# Patient Record
Sex: Female | Born: 1980 | Race: White | Hispanic: No | Marital: Single | Smoking: Current every day smoker
Health system: Southern US, Community
[De-identification: ages and names within clinical notes are randomized; demographics above are authoritative.]

## PROBLEM LIST (undated history)

## (undated) DIAGNOSIS — T4145XA Adverse effect of unspecified anesthetic, initial encounter: Secondary | ICD-10-CM

## (undated) DIAGNOSIS — K219 Gastro-esophageal reflux disease without esophagitis: Secondary | ICD-10-CM

## (undated) DIAGNOSIS — F32A Depression, unspecified: Secondary | ICD-10-CM

## (undated) DIAGNOSIS — M1990 Unspecified osteoarthritis, unspecified site: Secondary | ICD-10-CM

## (undated) DIAGNOSIS — F419 Anxiety disorder, unspecified: Secondary | ICD-10-CM

## (undated) DIAGNOSIS — R519 Headache, unspecified: Secondary | ICD-10-CM

## (undated) DIAGNOSIS — F329 Major depressive disorder, single episode, unspecified: Secondary | ICD-10-CM

## (undated) DIAGNOSIS — N200 Calculus of kidney: Secondary | ICD-10-CM

## (undated) DIAGNOSIS — T8859XA Other complications of anesthesia, initial encounter: Secondary | ICD-10-CM

## (undated) DIAGNOSIS — Z8489 Family history of other specified conditions: Secondary | ICD-10-CM

## (undated) DIAGNOSIS — R51 Headache: Secondary | ICD-10-CM

## (undated) DIAGNOSIS — J302 Other seasonal allergic rhinitis: Secondary | ICD-10-CM

## (undated) HISTORY — PX: LABIOPLASTY: SHX1900

## (undated) HISTORY — PX: WISDOM TOOTH EXTRACTION: SHX21

---

## 2004-11-10 ENCOUNTER — Emergency Department: Payer: Self-pay | Admitting: Emergency Medicine

## 2005-02-20 ENCOUNTER — Ambulatory Visit: Payer: Self-pay | Admitting: Internal Medicine

## 2005-02-25 ENCOUNTER — Ambulatory Visit: Payer: Self-pay | Admitting: Internal Medicine

## 2005-10-07 ENCOUNTER — Ambulatory Visit: Payer: Self-pay | Admitting: Internal Medicine

## 2005-12-28 ENCOUNTER — Ambulatory Visit: Payer: Self-pay | Admitting: Obstetrics and Gynecology

## 2009-06-02 ENCOUNTER — Emergency Department: Payer: Self-pay | Admitting: Emergency Medicine

## 2012-06-07 ENCOUNTER — Ambulatory Visit: Payer: Self-pay | Admitting: Internal Medicine

## 2012-08-17 ENCOUNTER — Ambulatory Visit: Payer: Self-pay | Admitting: Obstetrics and Gynecology

## 2012-12-16 ENCOUNTER — Ambulatory Visit: Payer: Self-pay | Admitting: Unknown Physician Specialty

## 2014-01-31 ENCOUNTER — Ambulatory Visit: Payer: Self-pay | Admitting: Internal Medicine

## 2014-09-06 NOTE — H&P (Signed)
Chief Complaint:    Marilyn Jackson is a 34 y.o. female here for chronic pelvic pain.  Chronic pelvic pain: Started in Dec of last year. Right sided pelvic pain that radiates to right leg, is associated with eating sometimes, and sometimes voiding improves the pain or makes it worse. No dysparunia. Hx of kidney stones. Hx long term OCP use that stopped 2 months prior to the onset of this pain.  Prior workup included vaginal exam, with significant right pain and right pudendal neuralgia; CT abdomen and pelvis1/20/16 that was normal; TVUS 02/14/14 that was normal with trace fluid in cul de sac and physiologic cysts bilaterally; Trial of micronor and subsequently combined OCPs without relief from pain;  Prior labs: UA with many crystals, normal CBC, normal CMP, normal TSH. Last pap 09/2013 neg with neg HPV   Past Medical History:  has a past medical history of Obesity; ADD (attention deficit disorder); Right ureteral stone; Chronic migraine without aura (09/27/2013); and cervical fibromuscular dysplasia.  Past Surgical History:  has past surgical history that includes other surgery (12-28-2005) and Colposcopy (04-02-2005). Family History: family history includes Coronary artery disease in her other; Diabetes mellitus in her other; Hypertension in her father. Social History:  reports that she has been smoking. She has never used smokeless tobacco. She reports that she drinks alcohol. She reports that she does not use illicit drugs. OB/GYN History:  OB History    Gravida Para Term Preterm AB TAB SAB Ectopic Multiple Living        Allergies: has No Known Allergies. Medications:  Current outpatient prescriptions:  . cetirizine (ZYRTEC) 10 MG tablet, Take 10 mg by mouth once daily., Disp: , Rfl:  . cyanocobalamin (VITAMIN B12) 1000 MCG tablet, Take 1,000 mcg by mouth once daily., Disp: , Rfl:  . dextroamphetamine-amphetamine (ADDERALL) 10 mg  tablet, Take 1 tablet (10 mg total) by mouth 3 (three) times daily. Earliest Fill Date: 08/10/14, Disp: 90 tablet, Rfl: 0 . etodolac (LODINE) 400 MG tablet, TAKE 1 TABLET BY MOUTH TWICE DAILY WITH FOOD (Patient taking differently: TAKE 1 TABLET BY MOUTH ONCE DAILY), Disp: 60 tablet, Rfl: 0 . ranitidine (ZANTAC) 75 MG tablet, Take 75 mg by mouth once daily as needed. , Disp: , Rfl:  . sertraline (ZOLOFT) 100 MG tablet, take 1 tablet by mouth every morning, Disp: 30 tablet, Rfl: 0 . temazepam (RESTORIL) 30 mg capsule, Take 30 mg by mouth nightly., Disp: , Rfl: 0   Review of Systems: See HPI   Exam:   Filed Vitals:   09/05/14 1745  BP: 124/72  Pulse: 78   Body mass index is 39.77 kg/(m^2).  General: Well-developed, well-nourished white female in no acute distress  Lungs: CTA  CV : RRR without murmur  Breast: exam done in sitting and lying position : No dimpling or retraction, no dominant mass, no spontaneous discharge, no axillary adenopathy Neck: no thyromegaly, no lymphadenopathy Abdomen: soft, no masses, normal active bowel sounds, non-tender, no rebound tenderness, no hernias noted Genitalia:  Pelvic exam:   External: Tanner stage 5, normal female genitalia without lesions or masses  Bladder: Normal size without masses or tenderness, well-supported  Urethra: No lesions or discharge with palpation. Normal urethral size and location, no prolapse  Vagina: normal physiological discharge, without lesions or masses  Cervix: normal without lesions or masses  Adnexa: normal bimanual exam without masses or fullness - bilateral pain, R>L, no puedendal neuralgia noted todya  Uterus: Normal size and position without masses or tenderness.   Anus/Perineum: Normal external exam  Impression:   The encounter diagnosis was Pelvic pain in female.    Plan:   - Patient returns for a preoperative discussion  regarding her plans to proceed with surgical evaluation of her pelvic pain by dx lap and cystoscopy procedure. The patient and I discussed the technical aspects of the procedure including the potential for risks and complications. These include but are not limited to the risk of infection requiring post-operative antibiotics or further procedures. We talked about the risk of injury to adjacent organs including bladder, bowel, ureter, blood vessels or nerves. We talked about the need to convert to an open incision. We talked about the possible need for blood transfusion. We talked aboutpostop complications such asthromboembolic or cardiopulmonary complications. All of her questions were answered. Her preoperative exam was completed and the appropriate consents were signed. She is scheduled to undergo this procedure in the near future.  - Will plan on peritoneal biopsies, and cysto with distention to evaluate painful bladder syndrome (IC).   -If normal and no improvement, plan for referral to chronic pelvic pain clinic.  - referral to GI for evaluation of IBS sent.

## 2014-09-19 ENCOUNTER — Encounter
Admission: RE | Admit: 2014-09-19 | Discharge: 2014-09-19 | Disposition: A | Discharge status: Home / Self Care | Payer: BLUE CROSS/BLUE SHIELD | Source: Ambulatory Visit | Attending: Obstetrics and Gynecology | Admitting: Obstetrics and Gynecology

## 2014-09-19 DIAGNOSIS — K219 Gastro-esophageal reflux disease without esophagitis: Secondary | ICD-10-CM | POA: Insufficient documentation

## 2014-09-19 HISTORY — DX: Headache, unspecified: R51.9

## 2014-09-19 HISTORY — DX: Other seasonal allergic rhinitis: J30.2

## 2014-09-19 HISTORY — DX: Major depressive disorder, single episode, unspecified: F32.9

## 2014-09-19 HISTORY — DX: Depression, unspecified: F32.A

## 2014-09-19 HISTORY — DX: Calculus of kidney: N20.0

## 2014-09-19 HISTORY — DX: Headache: R51

## 2014-09-19 HISTORY — DX: Anxiety disorder, unspecified: F41.9

## 2014-09-19 HISTORY — DX: Gastro-esophageal reflux disease without esophagitis: K21.9

## 2014-09-19 HISTORY — DX: Unspecified osteoarthritis, unspecified site: M19.90

## 2014-09-19 NOTE — Patient Instructions (Addendum)
  Your procedure is scheduled on: Friday 09/28/2014 Report to Day Surgery. 2ND FLOOR MEDICAL MALL ENTRANCE To find out your arrival time please call [phone] between 1PM - 3PM on Thursday 09/27/2014.  Remember: Instructions that are not followed completely may result in serious medical risk, up to and including death, or upon the discretion of your surgeon and anesthesiologist your surgery may need to be rescheduled.    __X__ 1. Do not eat food or drink liquids after midnight. No gum chewing or hard candies.     __X__ 2. No Alcohol for 24 hours before or after surgery.   ____ 3. Bring all medications with you on the day of surgery if instructed.    __X__ 4. Notify your doctor if there is any change in your medical condition     (cold, fever, infections).     Do not wear jewelry, make-up, hairpins, clips or nail polish.  Do not wear lotions, powders, or perfumes. You may wear deodorant.  Do not shave 48 hours prior to surgery. Men may shave face and neck.  Do not bring valuables to the hospital.    [facility] is not responsible for any belongings or valuables.               Contacts, dentures or bridgework may not be worn into surgery.  Leave your suitcase in the car. After surgery it may be brought to your room.  For patients admitted to the hospital, discharge time is determined by your                treatment team.   Patients discharged the day of surgery will not be allowed to drive home.   Please read over the following fact sheets that you were given:   Surgical Site Infection Prevention   __X__ Take these medicines the morning of surgery with A SIP OF WATER:    1. ZOLOFT  2.   3.   4.  5.  6.  ____ Fleet Enema (as directed)   __X__ Use CHG Soap as directed  ____ Use inhalers on the day of surgery  ____ Stop metformin 2 days prior to surgery    ____ Take 1/2 of usual insulin dose the night before surgery and none on the morning of surgery.   ____ Stop  Coumadin/Plavix/aspirin on   __X__ Stop Anti-inflammatories on STARTING TODAY STOP ALEVE YOU MAY USE TYLENOL FOR ACHES OR PAINS   ___X_ Stop supplements until after surgery.  KRILL OIL, VITAMIN B12 ____ Bring C-Pap to the hospital.

## 2014-09-28 ENCOUNTER — Ambulatory Visit
Admission: RE | Admit: 2014-09-28 | Payer: BLUE CROSS/BLUE SHIELD | Source: Ambulatory Visit | Admitting: Obstetrics and Gynecology

## 2014-09-28 ENCOUNTER — Encounter: Admission: RE | Payer: Self-pay | Source: Ambulatory Visit

## 2014-09-28 SURGERY — LAPAROSCOPY, DIAGNOSTIC
Anesthesia: Choice

## 2014-10-10 NOTE — H&P (Signed)
Chief Complaint:    Patient ID: Marilyn Jackson is a 34 y.o. female presenting with Pre Op Consulting  on 10/10/2014  HPI:  Chronic pelvic pain: Started in Dec of last year. Right sided pelvic pain that radiates to right leg, is associated with eating sometimes, and sometimes voiding improves the pain or makes it worse. No dysparunia. Hx of kidney stones. Hx long term OCP use that stopped 2 months prior to the onset of this pain.  Prior workup included vaginal exam, with significant right pain and right pudendal neuralgia; CT abdomen and pelvis1/20/16 that was normal; TVUS 02/14/14 that was normal with trace fluid in cul de sac and physiologic cysts bilaterally; Trial of micronor and subsequently combined OCPs without relief from pain but made her feel worse.  Prior labs: UA with many crystals, normal CBC, normal CMP, normal TSH. Last pap 09/2013 neg with neg HPV  Past Medical History:  has a past medical history of ADD (attention deficit disorder); Chronic migraine without aura (09/27/2013); cervical fibromuscular dysplasia; Obesity; and Right ureteral stone.  Past Surgical History:  has a past surgical history that includes other surgery (12-28-2005) and Colposcopy (04-02-2005). Family History: family history includes Coronary artery disease in her other; Diabetes mellitus in her other; Hypertension in her father. Social History:  reports that she has been smoking. She has been smoking about 0.50 packs per day. She has never used smokeless tobacco. She reports that she drinks alcohol. She reports that she does not use illicit drugs. OB/GYN History:  OB History    Gravida Para Term Preterm AB TAB SAB Ectopic Multiple Living        Allergies: has No Known Allergies. Medications:  Current Outpatient Prescriptions:  . cetirizine (ZYRTEC) 10 MG tablet, Take 10 mg by mouth once daily., Disp: , Rfl:  . cyanocobalamin (VITAMIN B12) 1000 MCG tablet, Take 1,000  mcg by mouth once daily., Disp: , Rfl:  . dextroamphetamine-amphetamine (ADDERALL) 10 mg tablet, Take 1 tablet (10 mg total) by mouth 3 (three) times daily. Earliest Fill Date: 09/27/14, Disp: 90 tablet, Rfl: 0 . etodolac (LODINE) 400 MG tablet, TAKE 1 TABLET BY MOUTH TWICE DAILY WITH FOOD (Patient taking differently: TAKE 1 TABLET BY MOUTH ONCE DAILY), Disp: 60 tablet, Rfl: 0 . oxyCODONE-acetaminophen (PERCOCET) 5-325 mg tablet, Take 1 tablet by mouth every 6 (six) hours as needed for Pain., Disp: 8 tablet, Rfl: 0 . ranitidine (ZANTAC) 75 MG tablet, Take 75 mg by mouth once daily as needed. , Disp: , Rfl:  . sertraline (ZOLOFT) 100 MG tablet, take 1 tablet by mouth every morning, Disp: 30 tablet, Rfl: 0 . temazepam (RESTORIL) 30 mg capsule, Take 30 mg by mouth nightly., Disp: , Rfl: 0  Review of Systems  Constitutional: Negative. Negative for fatigue and fever.  HENT: Negative.  Respiratory: Negative. Negative for shortness of breath.  Cardiovascular: Negative for chest pain and palpitations.  Gastrointestinal: Negative for abdominal pain, constipation and diarrhea.  Endocrine: Negative for cold intolerance.  Genitourinary: Positive for pelvic pain. Negative for difficulty urinating, dyspareunia, dysuria, frequency, hematuria, menstrual problem, urgency, vaginal bleeding, vaginal discharge and vaginal pain.  + Pelvic pain  Neurological: Negative for dizziness and light-headedness.  Psychiatric/Behavioral:  No changes in mood    Exam:         Visit Vitals  . BP 106/81  . Pulse 96  . Wt (!) 107.5 kg (237 lb)  . BMI 39.44 kg/m2  Physical Exam  Constitutional: She is oriented to person, place, and time. She appears well-developed and well-nourished. No distress.  Eyes: No scleral icterus.  Neck: Normal range of motion. Neck supple. No tracheal deviation present. No thyromegaly present.  Cardiovascular: Normal rate, regular rhythm and normal heart sounds.  No murmur  heard. Pulmonary/Chest: Effort normal and breath sounds normal. She has no wheezes. She has no rales.  Abdominal: She exhibits no distension and no mass. There is no tenderness. There is no rebound and no guarding.  Musculoskeletal: Normal range of motion.  Lymphadenopathy:  She has no cervical adenopathy.  Neurological: She is alert and oriented to person, place, and time.  Skin: Skin is warm and dry.  Psychiatric: She has a normal mood and affect.     Impression:   The primary encounter diagnosis was Pelvic pain in female. A diagnosis of Obesity (BMI 35.0-39.9 without comorbidity) was also pertinent to this visit.    Plan:   - Patient returns for a preoperative discussion regarding her plans to proceed with surgical evaluation of her pelvic pain by dx lap and cystoscopy procedure.   She was dx with intestinal parasites, and has been treated once. She is remaking her appointment with GI to evaluate.   The patient and I discussed the technical aspects of the procedure including the potential for risks and complications. These include but are not limited to the risk of infection requiring post-operative antibiotics or further procedures. We talked about the risk of injury to adjacent organs including bladder, bowel, ureter, blood vessels or nerves. We talked about the need to convert to an open incision. We talked about the possible need for blood transfusion. We talked aboutpostop complications such asthromboembolic or cardiopulmonary complications. All of her questions were answered. Her preoperative exam was completed and the appropriate consents were signed. She is scheduled to undergo this procedure in the near future.  - Will plan on peritoneal biopsies, and cysto with distention to evaluate painful bladder syndrome (IC).   -If normal and no improvement, plan for referral to chronic pelvic pain clinic.  - referral to GI for evaluation of IBS  renewed.

## 2014-10-12 ENCOUNTER — Encounter
Admission: RE | Admit: 2014-10-12 | Discharge: 2014-10-12 | Disposition: A | Discharge status: Home / Self Care | Payer: BLUE CROSS/BLUE SHIELD | Source: Ambulatory Visit | Attending: Obstetrics and Gynecology | Admitting: Obstetrics and Gynecology

## 2014-10-12 DIAGNOSIS — K219 Gastro-esophageal reflux disease without esophagitis: Secondary | ICD-10-CM | POA: Diagnosis not present

## 2014-10-12 DIAGNOSIS — G8929 Other chronic pain: Secondary | ICD-10-CM | POA: Diagnosis not present

## 2014-10-12 DIAGNOSIS — F988 Other specified behavioral and emotional disorders with onset usually occurring in childhood and adolescence: Secondary | ICD-10-CM | POA: Diagnosis not present

## 2014-10-12 DIAGNOSIS — Z87442 Personal history of urinary calculi: Secondary | ICD-10-CM | POA: Diagnosis not present

## 2014-10-12 DIAGNOSIS — Z79899 Other long term (current) drug therapy: Secondary | ICD-10-CM | POA: Diagnosis not present

## 2014-10-12 DIAGNOSIS — Z6835 Body mass index (BMI) 35.0-35.9, adult: Secondary | ICD-10-CM | POA: Diagnosis not present

## 2014-10-12 DIAGNOSIS — E669 Obesity, unspecified: Secondary | ICD-10-CM | POA: Diagnosis not present

## 2014-10-12 DIAGNOSIS — F418 Other specified anxiety disorders: Secondary | ICD-10-CM | POA: Diagnosis not present

## 2014-10-12 DIAGNOSIS — G43709 Chronic migraine without aura, not intractable, without status migrainosus: Secondary | ICD-10-CM | POA: Diagnosis not present

## 2014-10-12 DIAGNOSIS — Z8249 Family history of ischemic heart disease and other diseases of the circulatory system: Secondary | ICD-10-CM | POA: Diagnosis not present

## 2014-10-12 DIAGNOSIS — F17200 Nicotine dependence, unspecified, uncomplicated: Secondary | ICD-10-CM | POA: Diagnosis not present

## 2014-10-12 DIAGNOSIS — R102 Pelvic and perineal pain: Secondary | ICD-10-CM | POA: Diagnosis present

## 2014-10-12 DIAGNOSIS — Z833 Family history of diabetes mellitus: Secondary | ICD-10-CM | POA: Diagnosis not present

## 2014-10-12 DIAGNOSIS — N803 Endometriosis of pelvic peritoneum: Secondary | ICD-10-CM | POA: Diagnosis not present

## 2014-10-12 HISTORY — DX: Adverse effect of unspecified anesthetic, initial encounter: T41.45XA

## 2014-10-12 HISTORY — DX: Family history of other specified conditions: Z84.89

## 2014-10-12 HISTORY — DX: Other complications of anesthesia, initial encounter: T88.59XA

## 2014-10-12 LAB — BASIC METABOLIC PANEL
ANION GAP: 6 (ref 5–15)
BUN: 10 mg/dL (ref 6–20)
CALCIUM: 9 mg/dL (ref 8.9–10.3)
CO2: 28 mmol/L (ref 22–32)
CREATININE: 0.61 mg/dL (ref 0.44–1.00)
Chloride: 108 mmol/L (ref 101–111)
GFR calc Af Amer: >60 mL/min (ref >60)
GLUCOSE: 75 mg/dL (ref 65–99)
Potassium: 3.5 mmol/L (ref 3.5–5.1)
Sodium: 142 mmol/L (ref 135–145)

## 2014-10-12 LAB — TYPE AND SCREEN
ABO/RH(D): A POS
ANTIBODY SCREEN: NEGATIVE

## 2014-10-12 LAB — CBC
HCT: 41.3 % (ref 35.0–47.0)
HEMOGLOBIN: 13.9 g/dL (ref 12.0–16.0)
MCH: 30.2 pg (ref 26.0–34.0)
MCHC: 33.7 g/dL (ref 32.0–36.0)
MCV: 89.7 fL (ref 80.0–100.0)
PLATELETS: 235 10*3/uL (ref 150–440)
RBC: 4.6 MIL/uL (ref 3.80–5.20)
RDW: 12.7 % (ref 11.5–14.5)
WBC: 8.2 10*3/uL (ref 3.6–11.0)

## 2014-10-12 LAB — ABO/RH: ABO/RH(D): A POS

## 2014-10-12 NOTE — Patient Instructions (Addendum)
  Your procedure is scheduled on: Monday Oct. 3, 2016, arrival time is 6:00 am. Report to Same Day Surgery.   Remember: Instructions that are not followed completely may result in serious medical risk, up to and including death, or upon the discretion of your surgeon and anesthesiologist your surgery may need to be rescheduled.    __x__ 1. Do not eat food or drink liquids after midnight. No gum chewing or hard candies.     __x__ 2. No Alcohol for 24 hours before or after surgery.   ____ 3. Bring all medications with you on the day of surgery if instructed.    __x__ 4. Notify your doctor if there is any change in your medical condition     (cold, fever, infections).     Do not wear jewelry, make-up, hairpins, clips or nail polish.  Do not wear lotions, powders, or perfumes. You may wear deodorant.  Do not shave 48 hours prior to surgery. Men may shave face and neck.  Do not bring valuables to the hospital.    [facility] is not responsible for any belongings or valuables.               Contacts, dentures or bridgework may not be worn into surgery.  Leave your suitcase in the car. After surgery it may be brought to your room.  For patients admitted to the hospital, discharge time is determined by your treatment team.   Patients discharged the day of surgery will not be allowed to drive home.    Please read over the following fact sheets that you were given:   [facility] Preparing for Surgery  _x___ Take these medicines the morning of surgery with A SIP OF WATER:    1. sertraline (ZOLOFT)   ____ Fleet Enema (as directed)   ____ Use CHG Soap as directed  ____ Use inhalers on the day of surgery  ____ Stop metformin 2 days prior to surgery    ____ Take 1/2 of usual insulin dose the night before surgery and none on the morning of surgery.   ____ Stop Coumadin/Plavix/aspirin on does not apply.  _x___ Stop Anti-inflammatories now, Tylenol OK to take for pain.   _x___ Stop  supplements until after surgery.    ____ Bring C-Pap to the hospital.

## 2014-10-15 ENCOUNTER — Ambulatory Visit: Payer: BLUE CROSS/BLUE SHIELD | Admitting: Anesthesiology

## 2014-10-15 ENCOUNTER — Encounter
Admission: RE | Disposition: A | Discharge status: Home / Self Care | Payer: Self-pay | Source: Ambulatory Visit | Attending: Obstetrics and Gynecology

## 2014-10-15 ENCOUNTER — Ambulatory Visit
Admission: RE | Admit: 2014-10-15 | Discharge: 2014-10-15 | Disposition: A | Discharge status: Home / Self Care | Payer: BLUE CROSS/BLUE SHIELD | Source: Ambulatory Visit | Attending: Obstetrics and Gynecology | Admitting: Obstetrics and Gynecology

## 2014-10-15 ENCOUNTER — Encounter: Payer: Self-pay | Admitting: Obstetrics and Gynecology

## 2014-10-15 DIAGNOSIS — G43709 Chronic migraine without aura, not intractable, without status migrainosus: Secondary | ICD-10-CM | POA: Insufficient documentation

## 2014-10-15 DIAGNOSIS — Z833 Family history of diabetes mellitus: Secondary | ICD-10-CM | POA: Insufficient documentation

## 2014-10-15 DIAGNOSIS — Z87442 Personal history of urinary calculi: Secondary | ICD-10-CM | POA: Insufficient documentation

## 2014-10-15 DIAGNOSIS — G8929 Other chronic pain: Secondary | ICD-10-CM | POA: Insufficient documentation

## 2014-10-15 DIAGNOSIS — K219 Gastro-esophageal reflux disease without esophagitis: Secondary | ICD-10-CM | POA: Insufficient documentation

## 2014-10-15 DIAGNOSIS — Z6835 Body mass index (BMI) 35.0-35.9, adult: Secondary | ICD-10-CM | POA: Insufficient documentation

## 2014-10-15 DIAGNOSIS — F418 Other specified anxiety disorders: Secondary | ICD-10-CM | POA: Insufficient documentation

## 2014-10-15 DIAGNOSIS — E669 Obesity, unspecified: Secondary | ICD-10-CM | POA: Insufficient documentation

## 2014-10-15 DIAGNOSIS — R102 Pelvic and perineal pain: Secondary | ICD-10-CM | POA: Insufficient documentation

## 2014-10-15 DIAGNOSIS — F17200 Nicotine dependence, unspecified, uncomplicated: Secondary | ICD-10-CM | POA: Insufficient documentation

## 2014-10-15 DIAGNOSIS — Z8249 Family history of ischemic heart disease and other diseases of the circulatory system: Secondary | ICD-10-CM | POA: Insufficient documentation

## 2014-10-15 DIAGNOSIS — F988 Other specified behavioral and emotional disorders with onset usually occurring in childhood and adolescence: Secondary | ICD-10-CM | POA: Insufficient documentation

## 2014-10-15 DIAGNOSIS — N803 Endometriosis of pelvic peritoneum: Secondary | ICD-10-CM | POA: Insufficient documentation

## 2014-10-15 DIAGNOSIS — Z79899 Other long term (current) drug therapy: Secondary | ICD-10-CM | POA: Insufficient documentation

## 2014-10-15 HISTORY — PX: CYSTOSCOPY: SHX5120

## 2014-10-15 HISTORY — PX: LAPAROSCOPY: SHX197

## 2014-10-15 LAB — POCT PREGNANCY, URINE: Preg Test, Ur: NEGATIVE

## 2014-10-15 SURGERY — LAPAROSCOPY, DIAGNOSTIC
Anesthesia: General | Wound class: Clean Contaminated

## 2014-10-15 MED ORDER — LACTATED RINGERS IV SOLN: INTRAVENOUS | Status: DC

## 2014-10-15 MED ORDER — ONDANSETRON HCL 4 MG/2ML IJ SOLN: 4.0000 mg | Freq: Once | INTRAMUSCULAR | Status: DC | PRN

## 2014-10-15 MED ORDER — FENTANYL CITRATE (PF) 100 MCG/2ML IJ SOLN
INTRAMUSCULAR | Status: AC
  Filled 2014-10-15: qty 2

## 2014-10-15 MED ORDER — EPHEDRINE SULFATE 50 MG/ML IJ SOLN
INTRAMUSCULAR | Status: DC | PRN
  Administered 2014-10-15: 5 mg via INTRAVENOUS

## 2014-10-15 MED ORDER — GLYCOPYRROLATE 0.2 MG/ML IJ SOLN
INTRAMUSCULAR | Status: DC | PRN
  Administered 2014-10-15: 0.6 mg via INTRAVENOUS

## 2014-10-15 MED ORDER — FENTANYL CITRATE (PF) 100 MCG/2ML IJ SOLN
25.0000 ug | INTRAMUSCULAR | Status: DC | PRN
  Administered 2014-10-15 (×4): 25 ug via INTRAVENOUS

## 2014-10-15 MED ORDER — SUCCINYLCHOLINE CHLORIDE 20 MG/ML IJ SOLN
INTRAMUSCULAR | Status: DC | PRN
  Administered 2014-10-15: 100 mg via INTRAVENOUS

## 2014-10-15 MED ORDER — MIDAZOLAM HCL 2 MG/2ML IJ SOLN
INTRAMUSCULAR | Status: DC | PRN
  Administered 2014-10-15: 2 mg via INTRAVENOUS

## 2014-10-15 MED ORDER — ONDANSETRON HCL 4 MG/2ML IJ SOLN
INTRAMUSCULAR | Status: DC | PRN
  Administered 2014-10-15: 4 mg via INTRAVENOUS

## 2014-10-15 MED ORDER — BUPIVACAINE HCL (PF) 0.5 % IJ SOLN
INTRAMUSCULAR | Status: AC
  Filled 2014-10-15: qty 30

## 2014-10-15 MED ORDER — FAMOTIDINE 20 MG PO TABS
ORAL_TABLET | ORAL | Status: AC
  Filled 2014-10-15: qty 1

## 2014-10-15 MED ORDER — LIDOCAINE HCL (CARDIAC) 20 MG/ML IV SOLN
INTRAVENOUS | Status: DC | PRN
  Administered 2014-10-15: 100 mg via INTRAVENOUS

## 2014-10-15 MED ORDER — DEXAMETHASONE SODIUM PHOSPHATE 4 MG/ML IJ SOLN
INTRAMUSCULAR | Status: DC | PRN
  Administered 2014-10-15: 5 mg via INTRAVENOUS

## 2014-10-15 MED ORDER — OXYCODONE-ACETAMINOPHEN 5-325 MG PO TABS
ORAL_TABLET | ORAL | Status: AC
  Filled 2014-10-15: qty 1

## 2014-10-15 MED ORDER — LACTATED RINGERS IV SOLN
INTRAVENOUS | Status: DC
  Administered 2014-10-15 (×2): via INTRAVENOUS

## 2014-10-15 MED ORDER — SODIUM CHLORIDE 0.9 % IR SOLN
Status: DC | PRN
  Administered 2014-10-15: 750 mL via INTRAVESICAL

## 2014-10-15 MED ORDER — METHYLENE BLUE 1 % INJ SOLN
INTRAMUSCULAR | Status: AC
  Filled 2014-10-15: qty 10

## 2014-10-15 MED ORDER — OXYCODONE-ACETAMINOPHEN 5-325 MG PO TABS
1.0000 | ORAL_TABLET | Freq: Four times a day (QID) | ORAL | Status: DC | PRN
  Administered 2014-10-15: 1 via ORAL

## 2014-10-15 MED ORDER — SUCCINYLCHOLINE CHLORIDE 20 MG/ML IJ SOLN: INTRAMUSCULAR | Status: DC | PRN

## 2014-10-15 MED ORDER — SILVER NITRATE-POT NITRATE 75-25 % EX MISC
CUTANEOUS | Status: AC
  Filled 2014-10-15: qty 2

## 2014-10-15 MED ORDER — NEOSTIGMINE METHYLSULFATE 10 MG/10ML IV SOLN
INTRAVENOUS | Status: DC | PRN
  Administered 2014-10-15: 3 mg via INTRAVENOUS

## 2014-10-15 MED ORDER — OXYCODONE-ACETAMINOPHEN 5-325 MG PO TABS: 1.0000 | ORAL_TABLET | Freq: Four times a day (QID) | ORAL | Status: AC | PRN

## 2014-10-15 MED ORDER — PROPOFOL 10 MG/ML IV BOLUS
INTRAVENOUS | Status: DC | PRN
Start: 2014-10-15 — End: 2014-10-15
  Administered 2014-10-15: 50 mg via INTRAVENOUS
  Administered 2014-10-15: 150 mg via INTRAVENOUS

## 2014-10-15 MED ORDER — FAMOTIDINE 20 MG PO TABS
20.0000 mg | ORAL_TABLET | Freq: Once | ORAL | Status: AC
  Administered 2014-10-15: 20 mg via ORAL

## 2014-10-15 MED ORDER — KETOROLAC TROMETHAMINE 30 MG/ML IJ SOLN
INTRAMUSCULAR | Status: DC | PRN
  Administered 2014-10-15: 30 mg via INTRAVENOUS

## 2014-10-15 MED ORDER — ROCURONIUM BROMIDE 100 MG/10ML IV SOLN
INTRAVENOUS | Status: DC | PRN
  Administered 2014-10-15: 5 mg via INTRAVENOUS
  Administered 2014-10-15: 35 mg via INTRAVENOUS

## 2014-10-15 MED ORDER — FENTANYL CITRATE (PF) 100 MCG/2ML IJ SOLN
INTRAMUSCULAR | Status: DC | PRN
  Administered 2014-10-15: 100 ug via INTRAVENOUS
  Administered 2014-10-15 (×2): 50 ug via INTRAVENOUS

## 2014-10-15 MED ORDER — BUPIVACAINE HCL 0.5 % IJ SOLN
INTRAMUSCULAR | Status: DC | PRN
  Administered 2014-10-15 (×2): 10 mL

## 2014-10-15 MED ORDER — DOCUSATE SODIUM 100 MG PO CAPS: 100.0000 mg | ORAL_CAPSULE | Freq: Two times a day (BID) | ORAL | Status: AC | PRN

## 2014-10-15 SURGICAL SUPPLY — 41 items
BAG URO DRAIN 2000ML W/SPOUT (MISCELLANEOUS) ×3 IMPLANT
BLADE SURG SZ11 CARB STEEL (BLADE) ×3 IMPLANT
CATH FOLEY 2WAY  5CC 16FR (CATHETERS) ×2
CATH ROBINSON RED A/P 16FR (CATHETERS) IMPLANT
CATH URTH 16FR FL 2W BLN LF (CATHETERS) ×1 IMPLANT
CHLORAPREP W/TINT 26ML (MISCELLANEOUS) ×3 IMPLANT
CLOSURE WOUND 1/4X4 (GAUZE/BANDAGES/DRESSINGS) ×1
DRSG TEGADERM 2-3/8X2-3/4 SM (GAUZE/BANDAGES/DRESSINGS) ×3 IMPLANT
ENDOPOUCH RETRIEVER 10 (MISCELLANEOUS) IMPLANT
GAUZE SPONGE NON-WVN 2X2 STRL (MISCELLANEOUS) ×1 IMPLANT
GLOVE BIO SURGEON STRL SZ 6.5 (GLOVE) ×4 IMPLANT
GLOVE BIO SURGEONS STRL SZ 6.5 (GLOVE) ×2
GLOVE INDICATOR 7.0 STRL GRN (GLOVE) ×6 IMPLANT
GOWN STRL REUS W/ TWL LRG LVL3 (GOWN DISPOSABLE) ×2 IMPLANT
GOWN STRL REUS W/TWL LRG LVL3 (GOWN DISPOSABLE) ×4
IRRIGATION STRYKERFLOW (MISCELLANEOUS) IMPLANT
IRRIGATOR STRYKERFLOW (MISCELLANEOUS)
IV LACTATED RINGERS 1000ML (IV SOLUTION) IMPLANT
KIT RM TURNOVER CYSTO AR (KITS) ×3 IMPLANT
LABEL OR SOLS (LABEL) ×3 IMPLANT
LIQUID BAND (GAUZE/BANDAGES/DRESSINGS) ×3 IMPLANT
NS IRRIG 500ML POUR BTL (IV SOLUTION) ×3 IMPLANT
PACK GYN LAPAROSCOPIC (MISCELLANEOUS) ×3 IMPLANT
PAD OB MATERNITY 4.3X12.25 (PERSONAL CARE ITEMS) ×3 IMPLANT
PAD PREP 24X41 OB/GYN DISP (PERSONAL CARE ITEMS) ×3 IMPLANT
SCISSORS METZENBAUM CVD 33 (INSTRUMENTS) ×3 IMPLANT
SET CYSTO W/LG BORE CLAMP LF (SET/KITS/TRAYS/PACK) ×3 IMPLANT
SHEARS HARMONIC ACE PLUS 36CM (ENDOMECHANICALS) IMPLANT
SLEEVE ENDOPATH XCEL 5M (ENDOMECHANICALS) ×3 IMPLANT
SPONGE VERSALON 2X2 STRL (MISCELLANEOUS) ×2
STRIP CLOSURE SKIN 1/4X4 (GAUZE/BANDAGES/DRESSINGS) ×2 IMPLANT
SURGILUBE 2OZ TUBE FLIPTOP (MISCELLANEOUS) IMPLANT
SUT MNCRL AB 4-0 PS2 18 (SUTURE) ×3 IMPLANT
SUT VIC AB 2-0 UR6 27 (SUTURE) ×3 IMPLANT
SUT VIC AB 4-0 SH 27 (SUTURE) ×2
SUT VIC AB 4-0 SH 27XANBCTRL (SUTURE) ×1 IMPLANT
SWABSTK COMLB BENZOIN TINCTURE (MISCELLANEOUS) ×3 IMPLANT
TROCAR ENDO BLADELESS 11MM (ENDOMECHANICALS) ×3 IMPLANT
TROCAR XCEL NON-BLD 5MMX100MML (ENDOMECHANICALS) ×3 IMPLANT
TROCAR XCEL UNIV SLVE 11M 100M (ENDOMECHANICALS) ×3 IMPLANT
TUBING INSUFFLATOR HI FLOW (MISCELLANEOUS) ×3 IMPLANT

## 2014-10-15 NOTE — Anesthesia Postprocedure Evaluation (Signed)
  Anesthesia Post-op Note  Patient: Marilyn Jackson  Procedure(s) Performed: Procedure(s): Diagnostic laparoscopy with biopsy and lysis of adhesions  (N/A) Cystoscopy  (N/A)  Anesthesia type:General  Patient location: PACU  Post pain: Pain level controlled  Post assessment: Post-op Vital signs reviewed, Patient's Cardiovascular Status Stable, Respiratory Function Stable, Patent Airway and No signs of Nausea or vomiting  Post vital signs: Reviewed and stable  Last Vitals:  Filed Vitals:   10/15/14 0959  BP: 101/66  Pulse: 79  Temp:   Resp: 18    Level of consciousness: awake, alert  and patient cooperative  Complications: No apparent anesthesia complications

## 2014-10-15 NOTE — Anesthesia Preprocedure Evaluation (Signed)
Anesthesia Evaluation  Patient identified by MRN, date of birth, ID band Patient awake    Reviewed: Allergy & Precautions, NPO status , Patient's Chart, lab work & pertinent test results  History of Anesthesia Complications (+) history of anesthetic complications (Family hx of ahlluccinations with pain meds)  Airway Mallampati: I  TM Distance: >3 FB Neck ROM: Full    Dental  (+) Teeth Intact   Pulmonary Current Smoker (1 ppd),           Cardiovascular negative cardio ROS       Neuro/Psych Anxiety Depression    GI/Hepatic Neg liver ROS, GERD (no meds )  ,  Endo/Other  negative endocrine ROS  Renal/GU Renal disease (stones)     Musculoskeletal   Abdominal   Peds  Hematology negative hematology ROS (+)   Anesthesia Other Findings   Reproductive/Obstetrics                             Anesthesia Physical Anesthesia Plan  ASA: II  Anesthesia Plan: General   Post-op Pain Management:    Induction: Intravenous  Airway Management Planned: Oral ETT  Additional Equipment:   Intra-op Plan:   Post-operative Plan:   Informed Consent: I have reviewed the patients History and Physical, chart, labs and discussed the procedure including the risks, benefits and alternatives for the proposed anesthesia with the patient or authorized representative who has indicated his/her understanding and acceptance.     Plan Discussed with:   Anesthesia Plan Comments:         Anesthesia Quick Evaluation

## 2014-10-15 NOTE — Op Note (Signed)
Marilyn Jackson PROCEDURE DATE: 10/15/2014  PREOPERATIVE DIAGNOSIS: Chronic pelvic pain POSTOPERATIVE DIAGNOSIS:  PROCEDURE: Diagnostic laparoscopy, lysis of adhesions for >50% of the case, diagnostic cystoscopy, fulguration of endometriosis lesions SURGEON:  Dr. Christeen Douglas ASSISTANT: CST  INDICATIONS: 34 y.o. G0 with history of chronic pelvic pain desiring surgical evaluation.   Please see preoperative notes for further details.  Risks of surgery were discussed with the patient including but not limited to: bleeding which may require transfusion or reoperation; infection which may require antibiotics; injury to bowel, bladder, ureters or other surrounding organs; need for additional procedures including laparotomy; thromboembolic phenomenon, incisional problems and other postoperative/anesthesia complications. Written informed consent was obtained.    FINDINGS:  Small uterus, normal ovaries and fallopian tubes bilaterally.  There is diffuse evidence of red, vesicular and powder burn endometriosis lesions in the posterior cul-de-sac, on the right ovary and in the peritoneum, as well as significant adhesions which were lysed.  Peritoneal biopsies were taken and sent to pathology. No other abdominal/pelvic abnormality.  Normal upper abdomen. Appendix not visualized secondary to bowel adhesions. Bladder without Hunner's lesions or visible endometriosis.  ANESTHESIA:    General INTRAVENOUS FLUIDS: 1100 ml ESTIMATED BLOOD LOSS: minimal URINE OUTPUT: 400 ml SPECIMENS: Peritoneal biopsies from LLQ, posterior cul de sac and anterior vesicouterine peritoneum on left. Right ovarian biopsy attempted. COMPLICATIONS: None immediate  PROCEDURE IN DETAIL:    The patient had sequential compression devices applied to her lower extremities while in the preoperative area.  She was then taken to the operating room where general anesthesia was administered and was found to be adequate.  She was placed in the  dorsal lithotomy position, and was prepped and draped in a sterile manner.  A Foley catheter was inserted into her bladder and attached to constant drainage and a uterine manipulator was then advanced into the uterus . After an adequate timeout was performed, attention was turned to the abdomen where an umbilical incision was made with the scalpel.  The Optiview 11-mm trocar and sleeve were then advanced without difficulty with the laparoscope under direct visualization into the abdomen.  The abdomen was then insufflated with carbon dioxide gas and adequate pneumoperitoneum was obtained.   A detailed survey of the patient's pelvis and abdomen revealed the findings as mentioned above.  Biopsy forceps were used to take peritoneal biopsies. The right ovary was unable to be biopsied safely, and this lesion was fulgurated instead. All visual lesions were removed sharply or fulgurated. The operative site was surveyed, and it was found to be hemostatic.  No intraoperative injury to surrounding organs was noted.  Pictures were taken of the quadrants and pelvis. The abdomen was desufflated and all instruments were then removed from the patient's abdomen. The uterine manipulator was removed without complications.  All incisions were closed with 4-0 Vicryl and Dermabond.   CYSTOCOPY The Foley catheter was removed and an uncomplicated cystoscopy was performed. 800 mL of fluid was instilled for hydro-distention, drained, and the bladder mucosa was visualized again with the above findings. Excellent efflux was noted from both ureteral orifices.   The patient tolerated the procedures well.  All instruments, needles, and sponge counts were correct x 2. The patient was taken to the recovery room in stable condition.

## 2014-10-15 NOTE — Discharge Instructions (Signed)

## 2014-10-15 NOTE — Transfer of Care (Signed)
Immediate Anesthesia Transfer of Care Note  Patient: Marilyn Jackson  Procedure(s) Performed: Procedure(s): Diagnostic laparoscopy with biopsy and lysis of adhesions  (N/A) Cystoscopy  (N/A)  Patient Location: PACU  Anesthesia Type:General  Level of Consciousness: awake, alert  and oriented  Airway & Oxygen Therapy: Patient Spontanous Breathing and Patient connected to face mask oxygen  Post-op Assessment: Report given to RN and Post -op Vital signs reviewed and stable  Post vital signs: stable  Last Vitals:  Filed Vitals:   10/15/14 0942  BP: 114/64  Pulse: 90  Temp: 36.2 C  Resp: 25    Complications: No apparent anesthesia complications

## 2014-10-15 NOTE — Interval H&P Note (Signed)
History and Physical Interval Note:  10/15/2014 7:30 AM  Marilyn Jackson  has presented today for surgery, with the diagnosis of PELVIC PAIN  The various methods of treatment have been discussed with the patient and family. After consideration of risks, benefits and other options for treatment, the patient has consented to  Procedure(s): LAPAROSCOPY DIAGNOSTIC (N/A) CYSTOSCOPY (N/A) as a surgical intervention .  The patient's history has been reviewed, patient examined, no change in status, stable for surgery.  I have reviewed the patient's chart and labs.  Questions were answered to the patient's satisfaction.     Christeen Douglas

## 2014-10-15 NOTE — Anesthesia Procedure Notes (Signed)
Procedure Name: Intubation Date/Time: 10/15/2014 7:43 AM Performed by: Irving Burton Pre-anesthesia Checklist: Patient identified, Emergency Drugs available, Suction available and Patient being monitored Patient Re-evaluated:Patient Re-evaluated prior to inductionOxygen Delivery Method: Circle system utilized Preoxygenation: Pre-oxygenation with 100% oxygen Intubation Type: IV induction Ventilation: Mask ventilation without difficulty Laryngoscope Size: Mac and 3 Grade View: Grade II Tube type: Oral Tube size: 7.0 mm Number of attempts: 1 Airway Equipment and Method: Patient positioned with wedge pillow and Stylet Placement Confirmation: ETT inserted through vocal cords under direct vision,  positive ETCO2 and breath sounds checked- equal and bilateral Secured at: 21 cm Tube secured with: Tape Dental Injury: Teeth and Oropharynx as per pre-operative assessment

## 2014-10-16 LAB — SURGICAL PATHOLOGY

## 2017-06-23 ENCOUNTER — Encounter: Payer: Self-pay | Admitting: Emergency Medicine

## 2017-06-23 ENCOUNTER — Emergency Department: Payer: BLUE CROSS/BLUE SHIELD

## 2017-06-23 ENCOUNTER — Emergency Department
Admission: EM | Admit: 2017-06-23 | Discharge: 2017-06-23 | Disposition: A | Discharge status: Home / Self Care | Payer: BLUE CROSS/BLUE SHIELD | Attending: Emergency Medicine | Admitting: Emergency Medicine

## 2017-06-23 DIAGNOSIS — R079 Chest pain, unspecified: Secondary | ICD-10-CM | POA: Diagnosis present

## 2017-06-23 DIAGNOSIS — Z79899 Other long term (current) drug therapy: Secondary | ICD-10-CM | POA: Insufficient documentation

## 2017-06-23 DIAGNOSIS — F17210 Nicotine dependence, cigarettes, uncomplicated: Secondary | ICD-10-CM | POA: Insufficient documentation

## 2017-06-23 LAB — BASIC METABOLIC PANEL
ANION GAP: 9 (ref 5–15)
BUN: 7 mg/dL (ref 6–20)
CALCIUM: 8.6 mg/dL — AB (ref 8.9–10.3)
CO2: 25 mmol/L (ref 22–32)
CREATININE: 0.63 mg/dL (ref 0.44–1.00)
Chloride: 104 mmol/L (ref 101–111)
GLUCOSE: 103 mg/dL — AB (ref 65–99)
Potassium: 3.2 mmol/L — ABNORMAL LOW (ref 3.5–5.1)
Sodium: 138 mmol/L (ref 135–145)

## 2017-06-23 LAB — CBC
HCT: 39.8 % (ref 35.0–47.0)
Hemoglobin: 13.4 g/dL (ref 12.0–16.0)
MCH: 28.7 pg (ref 26.0–34.0)
MCHC: 33.6 g/dL (ref 32.0–36.0)
MCV: 85.5 fL (ref 80.0–100.0)
PLATELETS: 274 10*3/uL (ref 150–440)
RBC: 4.66 MIL/uL (ref 3.80–5.20)
RDW: 14.1 % (ref 11.5–14.5)
WBC: 11.1 10*3/uL — ABNORMAL HIGH (ref 3.6–11.0)

## 2017-06-23 LAB — TROPONIN I

## 2017-06-23 MED ORDER — GI COCKTAIL ~~LOC~~
30.0000 mL | Freq: Once | ORAL | Status: AC
  Administered 2017-06-23: 30 mL via ORAL
  Filled 2017-06-23: qty 30

## 2017-06-23 NOTE — ED Triage Notes (Signed)
Pt arrived with husband with complaints of mid sternum chest pain that radiates to her shoulder and arm. Pt describes the pain as a tightness.

## 2017-06-23 NOTE — ED Provider Notes (Signed)
[facility] Emergency Department Provider Note ____________________________________________   First MD Initiated Contact with Patient 06/23/17 1643     (approximate)  I have reviewed the triage vital signs and the nursing notes.   HISTORY  Chief Complaint Chest Pain  HPI Marilyn Jackson is a 37 y.o. female with a history of GERD who is presenting to the emergency department today with burning and sharp chest pain that has been intermittent since yesterday.  Says that she ate at [employer] yesterday and then began having intermittent chest pain.  Says that about 2:30 PM today she had worsening of her chest pain that radiated up to her throat.  She vomited x1 but the chest pain has resolved at this time.  She denies any shortness of breath.  No diaphoresis.  Denies any history of diabetes or hypertension.  Says that she smokes a pack of cigarettes a day but does not use any drugs or drink.  She does take an estrogen containing birth control pill.  She says that she also takes a PPI at home.  Says that the pain worsened with laying down. Past Medical History:  Diagnosis Date  . Anxiety   . Arthritis   . Complication of anesthesia   . Depression   . Family history of adverse reaction to anesthesia    dad and gradparents have had hallucinations  . GERD (gastroesophageal reflux disease)   . Headache   . Kidney stones   . Seasonal allergies     There are no active problems to display for this patient.   Past Surgical History:  Procedure Laterality Date  . CYSTOSCOPY N/A 10/15/2014   Procedure: Cystoscopy ;  Surgeon: Christeen Douglas, MD;  Location: [facility] ORS;  Service: Gynecology;  Laterality: N/A;  . LABIOPLASTY    . LAPAROSCOPY N/A 10/15/2014   Procedure: Diagnostic laparoscopy with biopsy and lysis of adhesions ;  Surgeon: Christeen Douglas, MD;  Location: [facility] ORS;  Service: Gynecology;  Laterality: N/A;  . WISDOM TOOTH EXTRACTION      Prior to Admission  medications   Medication Sig Start Date End Date Taking? Authorizing Provider  amphetamine-dextroamphetamine (ADDERALL) 10 MG tablet Take 10 mg by mouth 3 (three) times daily as needed.    [provider]  Cyanocobalamin (VITAMIN B-12 PO) Take by mouth daily.    [provider]  docusate sodium (COLACE) 100 MG capsule Take 1 capsule (100 mg total) by mouth 2 (two) times daily as needed. 10/15/14   Christeen Douglas, MD  KRILL OIL PO Take 300 mg by mouth daily.    [provider]  naproxen sodium (ANAPROX) 220 MG tablet Take 220 mg by mouth 2 (two) times daily as needed.    [provider]  oxyCODONE-acetaminophen (PERCOCET/ROXICET) 5-325 MG tablet Take 1-2 tablets by mouth every 6 (six) hours as needed. 10/15/14   Christeen Douglas, MD  sertraline (ZOLOFT) 100 MG tablet Take 100 mg by mouth daily.    [provider]  temazepam (RESTORIL) 30 MG capsule Take 30 mg by mouth at bedtime.    [provider]    Allergies Tape  No family history on file.  Social History Social History   Tobacco Use  . Smoking status: Current Every Day Smoker    Packs/day: 0.50  . Smokeless tobacco: Never Used  Substance Use Topics  . Alcohol use: Yes    Comment: 1 glass of wine per month  . Drug use: No    Review  of Systems  Constitutional: No fever/chills Eyes: No visual changes. ENT: No sore throat. Cardiovascular: As above Respiratory: Denies shortness of breath. Gastrointestinal: No abdominal pain.   No diarrhea.  No constipation. Genitourinary: Negative for dysuria. Musculoskeletal: Negative for back pain. Skin: Negative for rash. Neurological: Negative for headaches, focal weakness or numbness.   ____________________________________________   PHYSICAL EXAM:  VITAL SIGNS: ED Triage Vitals  Enc Vitals Group     BP 06/23/17 1607 (!) 148/76     Pulse Rate 06/23/17 1607 (!) 101     Resp 06/23/17 1607 18     Temp 06/23/17 1607 98.8 F  (37.1 C)     Temp Source 06/23/17 1607 Oral     SpO2 06/23/17 1607 100 %     Weight 06/23/17 1605 220 lb (99.8 kg)     Height 06/23/17 1605 5\' 4"  (1.626 m)     Head Circumference --      Peak Flow --      Pain Score 06/23/17 1605 4     Pain Loc --      Pain Edu? --      Excl. in GC? --     Constitutional: Alert and oriented. Well appearing and in no acute distress. Eyes: Conjunctivae are normal.  Head: Atraumatic. Nose: No congestion/rhinnorhea. Mouth/Throat: Mucous membranes are moist.  No pharyngeal erythema. Neck: No stridor.   Cardiovascular: Normal rate, regular rhythm. Grossly normal heart sounds.  Good peripheral circulation with equal and bilateral radial as well as dorsalis pedis pulses.  Pain is not reproducible to palpation.  Pulse in the room of 81.   Respiratory: Normal respiratory effort.  No retractions. Lungs CTAB. Gastrointestinal: Soft and nontender. No distention. No CVA tenderness. Musculoskeletal: No lower extremity tenderness nor edema.  No joint effusions. Neurologic:  Normal speech and language. No gross focal neurologic deficits are appreciated. Skin:  Skin is warm, dry and intact. No rash noted. Psychiatric: Mood and affect are normal. Speech and behavior are normal.  ____________________________________________   LABS (all labs ordered are listed, but only abnormal results are displayed)  Labs Reviewed  BASIC METABOLIC PANEL - Abnormal; Notable for the following components:      Result Value   Potassium 3.2 (*)    Glucose, Bld 103 (*)    Calcium 8.6 (*)    All other components within normal limits  CBC - Abnormal; Notable for the following components:   WBC 11.1 (*)    All other components within normal limits  TROPONIN I  POC URINE PREG, ED   ____________________________________________  EKG  ED ECG REPORT I, Arelia Longestavid M Schaevitz, the attending physician, personally viewed and interpreted this ECG.   Date: 06/23/2017  EKG Time: 1605   Rate: 96  Rhythm: normal sinus rhythm  Axis: Rightward axis  Intervals:none  ST&T Change: No ST segment elevation or depression.  No abnormal T wave inversion.  ____________________________________________  RADIOLOGY  Chest x-ray without acute process ____________________________________________   PROCEDURES  Procedure(s) performed:   Procedures  Critical Care performed:   ____________________________________________   INITIAL IMPRESSION / ASSESSMENT AND PLAN / ED COURSE  Pertinent labs & imaging results that were available during my care of the patient were reviewed by me and considered in my medical decision making (see chart for details).  Differential diagnosis includes, but is not limited to, ACS, aortic dissection, pulmonary embolism, cardiac tamponade, pneumothorax, pneumonia, pericarditis, myocarditis, GI-related causes including esophagitis/gastritis, and musculoskeletal chest wall pain.   As part of my  medical decision making, I reviewed the following data within the electronic MEDICAL RECORD NUMBER Notes from prior outpatient visits  I cannot rule out the patient via the PE RC rule.  However, her symptoms have completely resolved.  She says they are similar to past episodes of GERD and she notes a trigger of eating [employer] yesterday.  Heart rate is now resolved as well.  No shortness of breath.  Unlikely to be PE given resolution of symptoms.  Also with reassuring cardiac work-up.  I discussed further work-up with the patient including a d-dimer and troponin.  However, she says that she would like to be discharged home at this time.  She is requesting a GI cocktail for fear that the symptoms will return.  She will follow-up with her primary care doctor and continue to take her PPI.  She knows to return for any worsening or concerning symptoms. ____________________________________________   FINAL CLINICAL IMPRESSION(S) / ED DIAGNOSES  Chest pain.    NEW MEDICATIONS  STARTED DURING THIS VISIT:  New Prescriptions   No medications on file     Note:  This document was prepared using Dragon voice recognition software and may include unintentional dictation errors.     Myrna Blazer, MD 06/23/17 Ric[facility] Primus

## 2017-06-24 ENCOUNTER — Ambulatory Visit
Admission: RE | Admit: 2017-06-24 | Discharge: 2017-06-24 | Disposition: A | Discharge status: Home / Self Care | Payer: BLUE CROSS/BLUE SHIELD | Source: Ambulatory Visit | Attending: Family Medicine | Admitting: Family Medicine

## 2017-06-24 ENCOUNTER — Other Ambulatory Visit: Payer: Self-pay | Admitting: Family Medicine

## 2017-06-24 DIAGNOSIS — R1031 Right lower quadrant pain: Secondary | ICD-10-CM

## 2017-06-24 DIAGNOSIS — R1011 Right upper quadrant pain: Secondary | ICD-10-CM | POA: Insufficient documentation

## 2017-06-24 MED ORDER — IOPAMIDOL (ISOVUE-300) INJECTION 61%
150.0000 mL | Freq: Once | INTRAVENOUS | Status: AC | PRN
  Administered 2017-06-24: 125 mL via INTRAVENOUS

## 2019-02-20 IMAGING — CT CT ABD-PELV W/ CM
1 of 2 series · 15 of 32 positions shown, 19 images · IV contrast (APPLIED)
Comparison: 01/31/2014

CLINICAL DATA: Right-sided abdominal pain for 1 day

EXAM:
CT ABDOMEN AND PELVIS WITH CONTRAST
TECHNIQUE: Multidetector CT imaging of the abdomen and pelvis was performed
using the standard protocol following bolus administration of
intravenous contrast.
CONTRAST:  125mL [id number] IOPAMIDOL ([id number]) INJECTION 61%

[Series 2: axial st · axial · 0.77mm/px · z∈[-1007,-557]mm · 15 of 100 slices shown, 19 images]
[im 5/100  soft-tissue]
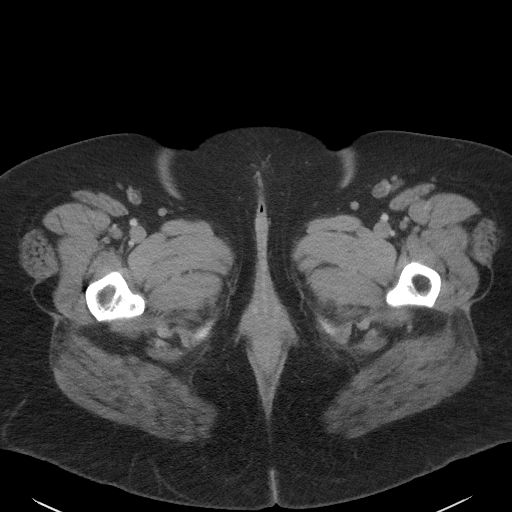
[im 5/100  bone]
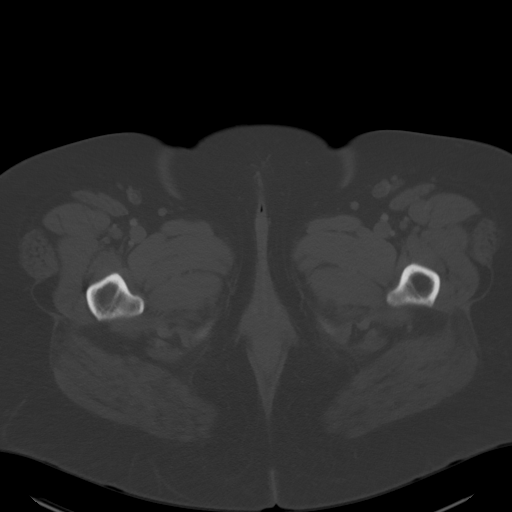
[im 14/100  soft-tissue]
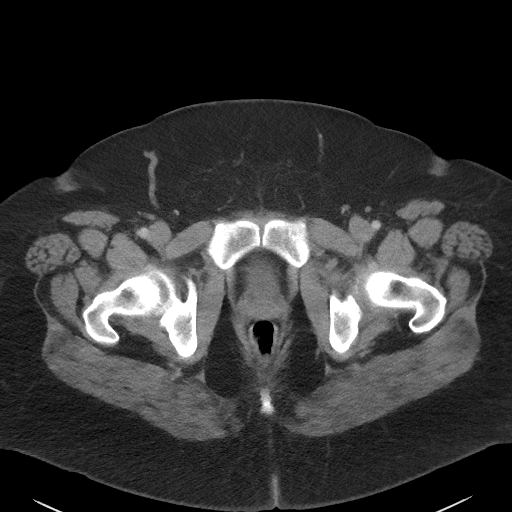
[im 23/100  soft-tissue]
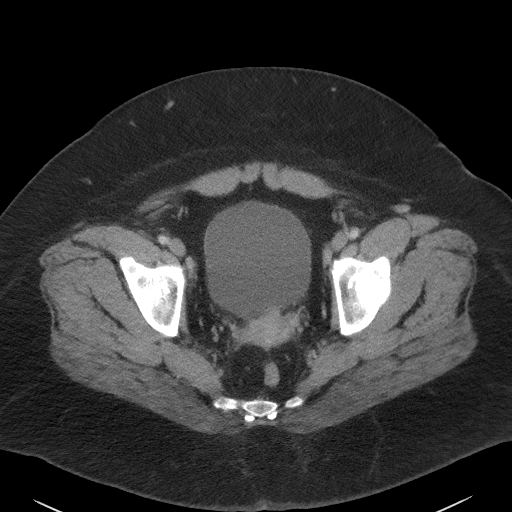
[im 28/100  soft-tissue]
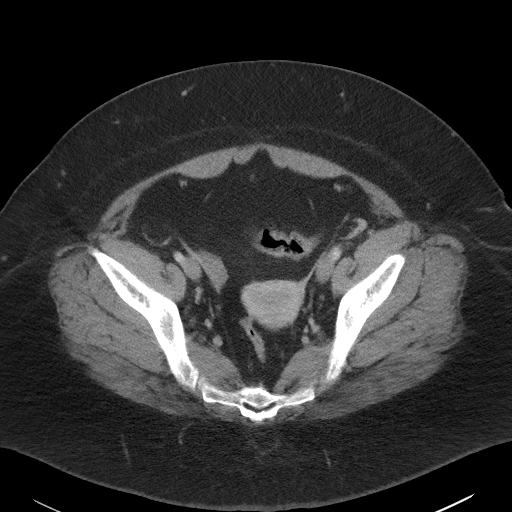
[im 37/100  soft-tissue]
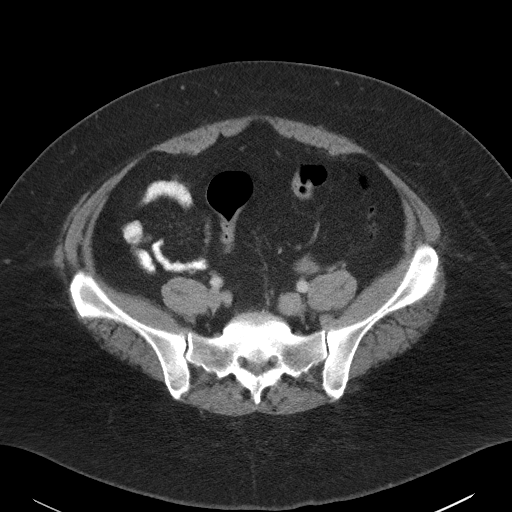
[im 41/100  soft-tissue]
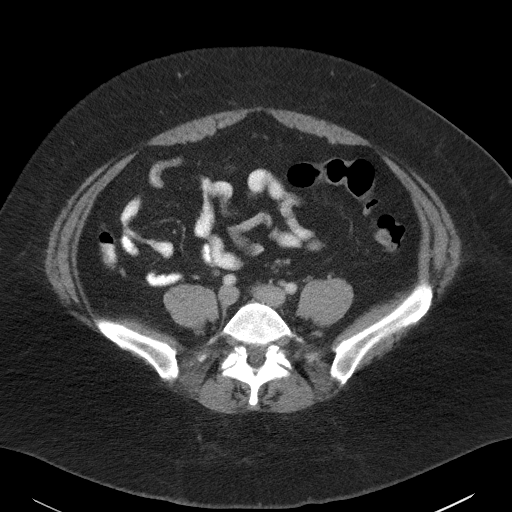
[im 50/100  soft-tissue]
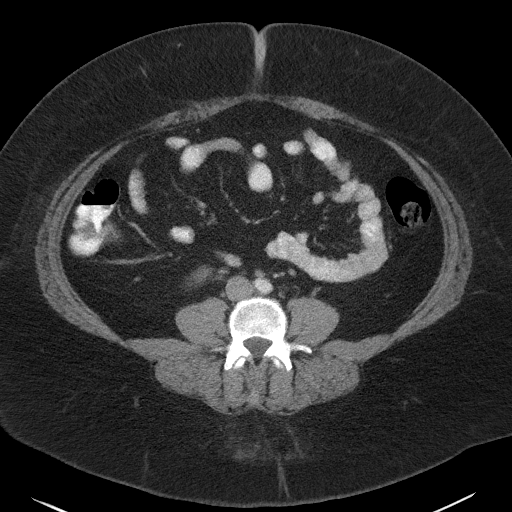
[im 59/100  soft-tissue]
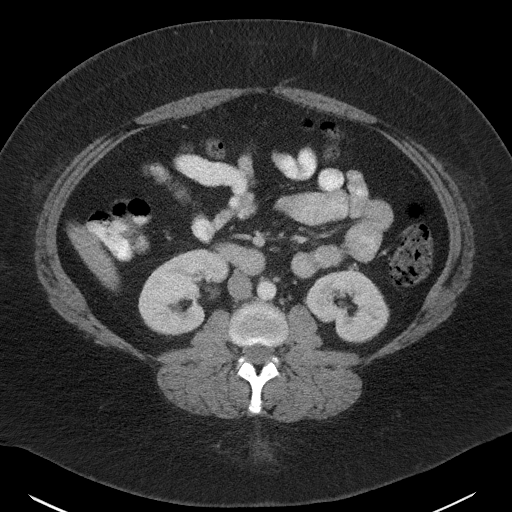
[im 64/100  soft-tissue]
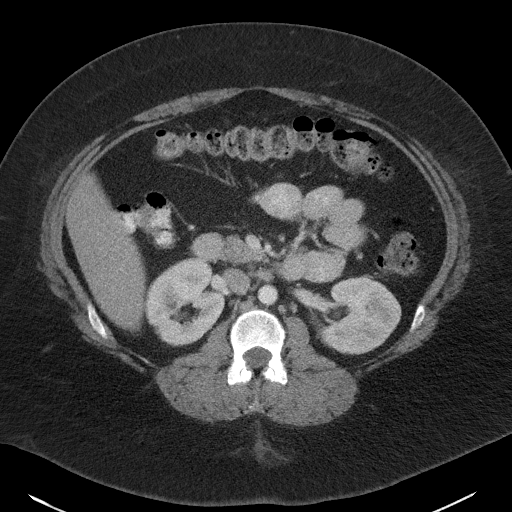
[im 64/100  bone]
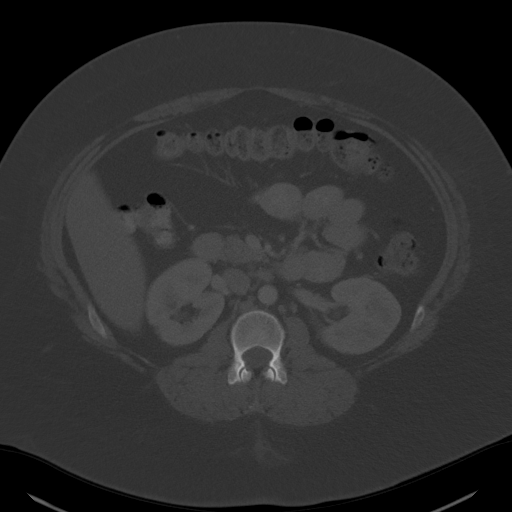
[im 73/100  soft-tissue]
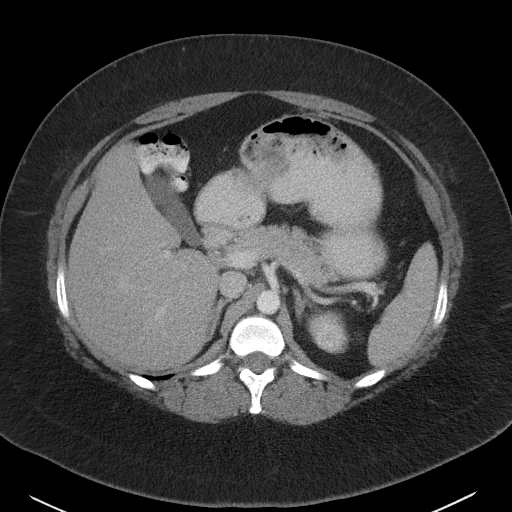
[im 77/100  soft-tissue]
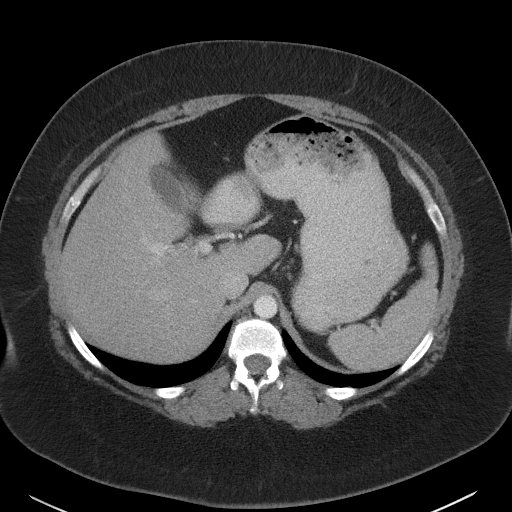
[im 82/100  lung]
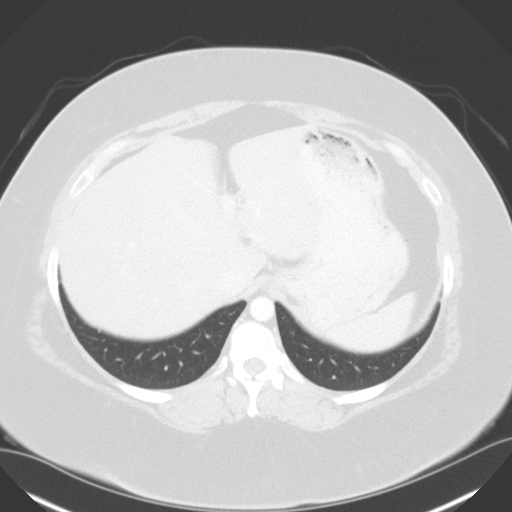
[im 86/100  soft-tissue]
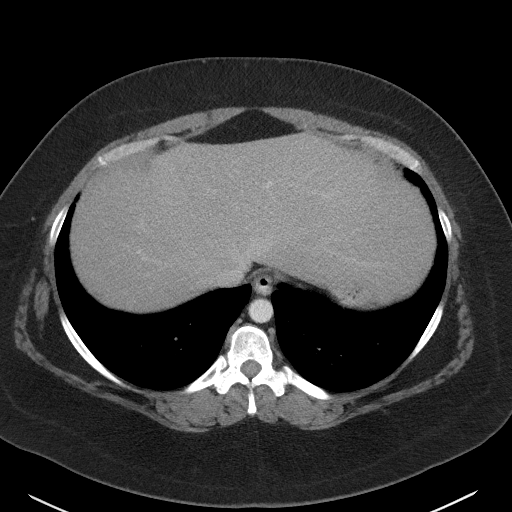
[im 86/100  lung]
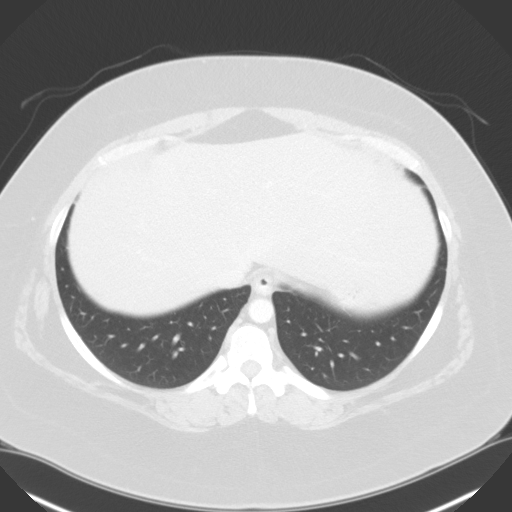
[im 91/100  lung]
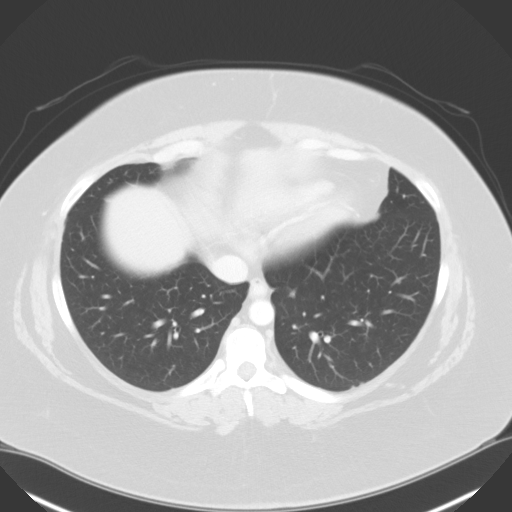
[im 95/100  soft-tissue]
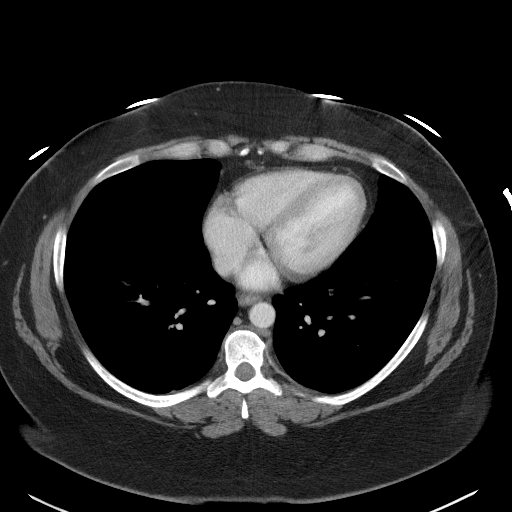
[im 95/100  lung]
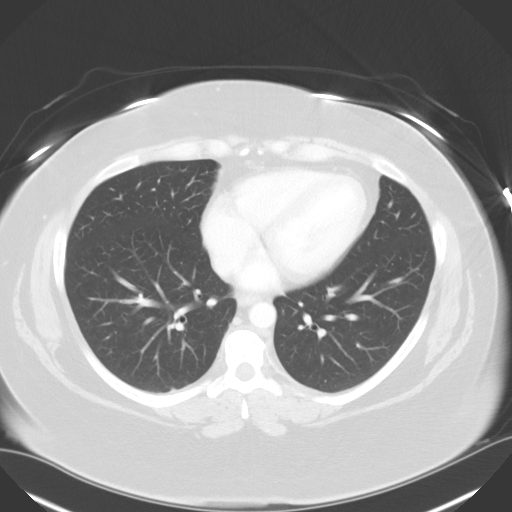

[15 of 32 positions shown; findings below may reference images not displayed]

FINDINGS: Lower chest: No acute abnormality.

Hepatobiliary: Mild fatty infiltration of liver is noted. The
gallbladder is within normal limits.

Pancreas: Unremarkable. No pancreatic ductal dilatation or
surrounding inflammatory changes.

Spleen: Normal in size without focal abnormality.

Adrenals/Urinary Tract: Adrenal glands are unremarkable bilaterally.
Kidneys demonstrate normal enhancement bilaterally. No renal calculi
or obstructive changes are seen. Bladder is well distended.

Stomach/Bowel: Mild diverticulosis is noted of the colon. No
diverticulitis is seen. The appendix is within normal limits.

Vascular/Lymphatic: No significant vascular findings are present. No
enlarged abdominal or pelvic lymph nodes.

Reproductive: Uterus and bilateral adnexa are unremarkable. A
previously seen right ovarian cyst has resolved in the interval from
the prior exam.

Other: No abdominal wall hernia or abnormality. No abdominopelvic
ascites.

Musculoskeletal: No acute or significant osseous findings.
IMPRESSION: Mild chronic changes as described above. No acute abnormality noted.

## 2021-02-07 DIAGNOSIS — M5414 Radiculopathy, thoracic region: Secondary | ICD-10-CM | POA: Diagnosis not present

## 2021-02-07 DIAGNOSIS — R519 Headache, unspecified: Secondary | ICD-10-CM | POA: Diagnosis not present

## 2021-02-07 DIAGNOSIS — M9901 Segmental and somatic dysfunction of cervical region: Secondary | ICD-10-CM | POA: Diagnosis not present

## 2021-02-07 DIAGNOSIS — M9902 Segmental and somatic dysfunction of thoracic region: Secondary | ICD-10-CM | POA: Diagnosis not present

## 2021-03-07 DIAGNOSIS — M5414 Radiculopathy, thoracic region: Secondary | ICD-10-CM | POA: Diagnosis not present

## 2021-03-07 DIAGNOSIS — R519 Headache, unspecified: Secondary | ICD-10-CM | POA: Diagnosis not present

## 2021-03-07 DIAGNOSIS — M9901 Segmental and somatic dysfunction of cervical region: Secondary | ICD-10-CM | POA: Diagnosis not present

## 2021-03-07 DIAGNOSIS — M9902 Segmental and somatic dysfunction of thoracic region: Secondary | ICD-10-CM | POA: Diagnosis not present

## 2021-03-19 DIAGNOSIS — Z01419 Encounter for gynecological examination (general) (routine) without abnormal findings: Secondary | ICD-10-CM | POA: Diagnosis not present

## 2021-04-02 DIAGNOSIS — Z23 Encounter for immunization: Secondary | ICD-10-CM | POA: Diagnosis not present

## 2021-04-02 DIAGNOSIS — G5622 Lesion of ulnar nerve, left upper limb: Secondary | ICD-10-CM | POA: Diagnosis not present

## 2021-04-02 DIAGNOSIS — E119 Type 2 diabetes mellitus without complications: Secondary | ICD-10-CM | POA: Diagnosis not present

## 2021-04-02 DIAGNOSIS — E538 Deficiency of other specified B group vitamins: Secondary | ICD-10-CM | POA: Diagnosis not present

## 2021-04-04 DIAGNOSIS — M9901 Segmental and somatic dysfunction of cervical region: Secondary | ICD-10-CM | POA: Diagnosis not present

## 2021-04-04 DIAGNOSIS — M5414 Radiculopathy, thoracic region: Secondary | ICD-10-CM | POA: Diagnosis not present

## 2021-04-04 DIAGNOSIS — R519 Headache, unspecified: Secondary | ICD-10-CM | POA: Diagnosis not present

## 2021-04-04 DIAGNOSIS — M9902 Segmental and somatic dysfunction of thoracic region: Secondary | ICD-10-CM | POA: Diagnosis not present

## 2021-05-02 DIAGNOSIS — M5414 Radiculopathy, thoracic region: Secondary | ICD-10-CM | POA: Diagnosis not present

## 2021-05-02 DIAGNOSIS — R519 Headache, unspecified: Secondary | ICD-10-CM | POA: Diagnosis not present

## 2021-05-02 DIAGNOSIS — M9901 Segmental and somatic dysfunction of cervical region: Secondary | ICD-10-CM | POA: Diagnosis not present

## 2021-05-02 DIAGNOSIS — M9902 Segmental and somatic dysfunction of thoracic region: Secondary | ICD-10-CM | POA: Diagnosis not present

## 2021-05-30 DIAGNOSIS — M9901 Segmental and somatic dysfunction of cervical region: Secondary | ICD-10-CM | POA: Diagnosis not present

## 2021-05-30 DIAGNOSIS — M9902 Segmental and somatic dysfunction of thoracic region: Secondary | ICD-10-CM | POA: Diagnosis not present

## 2021-05-30 DIAGNOSIS — M5414 Radiculopathy, thoracic region: Secondary | ICD-10-CM | POA: Diagnosis not present

## 2021-05-30 DIAGNOSIS — R519 Headache, unspecified: Secondary | ICD-10-CM | POA: Diagnosis not present

## 2021-06-27 DIAGNOSIS — R519 Headache, unspecified: Secondary | ICD-10-CM | POA: Diagnosis not present

## 2021-06-27 DIAGNOSIS — M5414 Radiculopathy, thoracic region: Secondary | ICD-10-CM | POA: Diagnosis not present

## 2021-06-27 DIAGNOSIS — M9902 Segmental and somatic dysfunction of thoracic region: Secondary | ICD-10-CM | POA: Diagnosis not present

## 2021-06-27 DIAGNOSIS — M9901 Segmental and somatic dysfunction of cervical region: Secondary | ICD-10-CM | POA: Diagnosis not present

## 2022-09-25 ENCOUNTER — Emergency Department: Payer: 59

## 2022-09-25 ENCOUNTER — Other Ambulatory Visit: Payer: Self-pay

## 2022-09-25 DIAGNOSIS — R109 Unspecified abdominal pain: Secondary | ICD-10-CM | POA: Diagnosis present

## 2022-09-25 DIAGNOSIS — Z5321 Procedure and treatment not carried out due to patient leaving prior to being seen by health care provider: Secondary | ICD-10-CM | POA: Diagnosis not present

## 2022-09-25 LAB — HCG, QUANTITATIVE, PREGNANCY: hCG, Beta Chain, Quant, S: <1 m[IU]/mL

## 2022-09-25 LAB — CBC WITH DIFFERENTIAL/PLATELET
Abs Immature Granulocytes: 0.03 10*3/uL (ref 0.00–0.07)
Basophils Absolute: 0.1 10*3/uL (ref 0.0–0.1)
Basophils Relative: 1 %
Eosinophils Absolute: 0.2 10*3/uL (ref 0.0–0.5)
Eosinophils Relative: 2 %
HCT: 38.3 % (ref 36.0–46.0)
Hemoglobin: 13.1 g/dL (ref 12.0–15.0)
Immature Granulocytes: 0 %
Lymphocytes Relative: 34 %
Lymphs Abs: 3.3 10*3/uL (ref 0.7–4.0)
MCH: 29.6 pg (ref 26.0–34.0)
MCHC: 34.2 g/dL (ref 30.0–36.0)
MCV: 86.7 fL (ref 80.0–100.0)
Monocytes Absolute: 0.5 10*3/uL (ref 0.1–1.0)
Monocytes Relative: 5 %
Neutro Abs: 5.7 10*3/uL (ref 1.7–7.7)
Neutrophils Relative %: 58 %
Platelets: 238 10*3/uL (ref 150–400)
RBC: 4.42 MIL/uL (ref 3.87–5.11)
RDW: 12.2 % (ref 11.5–15.5)
WBC: 9.9 10*3/uL (ref 4.0–10.5)
nRBC: 0 % (ref 0.0–0.2)

## 2022-09-25 LAB — BASIC METABOLIC PANEL
Anion gap: 7 (ref 5–15)
BUN: 8 mg/dL (ref 6–20)
CO2: 24 mmol/L (ref 22–32)
Calcium: 8.8 mg/dL — ABNORMAL LOW (ref 8.9–10.3)
Chloride: 106 mmol/L (ref 98–111)
Creatinine, Ser: 0.69 mg/dL (ref 0.44–1.00)
GFR, Estimated: >60 mL/min (ref >60)
Glucose, Bld: 119 mg/dL — ABNORMAL HIGH (ref 70–99)
Potassium: 3 mmol/L — ABNORMAL LOW (ref 3.5–5.1)
Sodium: 137 mmol/L (ref 135–145)

## 2022-09-25 MED ORDER — OXYCODONE-ACETAMINOPHEN 5-325 MG PO TABS
1.0000 | ORAL_TABLET | Freq: Once | ORAL | Status: AC
  Administered 2022-09-25: 1 via ORAL
  Filled 2022-09-25: qty 1

## 2022-09-25 NOTE — ED Triage Notes (Addendum)
Pt reports right side flank pain x1 day, pt has hx kidney stones. Pt denies any urinary symptoms. Pt reports heavier period than normal currently been menstruating for 5days. Pt has hx ovarian cyst

## 2022-09-26 ENCOUNTER — Emergency Department
Admission: EM | Admit: 2022-09-26 | Discharge: 2022-09-26 | Discharge status: Against Medical Advice | Payer: 59 | Attending: Emergency Medicine | Admitting: Emergency Medicine
